# Patient Record
Sex: Female | Born: 1995 | Race: Black or African American | Hispanic: No | Marital: Single | Smoking: Never smoker
Health system: Southern US, Community
[De-identification: ages and names within clinical notes are randomized; demographics above are authoritative.]

## PROBLEM LIST (undated history)

## (undated) DIAGNOSIS — Z973 Presence of spectacles and contact lenses: Secondary | ICD-10-CM

## (undated) DIAGNOSIS — J4520 Mild intermittent asthma, uncomplicated: Secondary | ICD-10-CM

## (undated) DIAGNOSIS — R109 Unspecified abdominal pain: Secondary | ICD-10-CM

## (undated) DIAGNOSIS — R10A2 Flank pain, left side: Secondary | ICD-10-CM

## (undated) DIAGNOSIS — R319 Hematuria, unspecified: Secondary | ICD-10-CM

## (undated) HISTORY — PX: WISDOM TOOTH EXTRACTION: SHX21

---
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
## 2017-01-06 ENCOUNTER — Ambulatory Visit: Payer: 59 | Admitting: Internal Medicine

## 2017-01-20 ENCOUNTER — Ambulatory Visit: Payer: 59 | Admitting: Internal Medicine

## 2017-07-05 ENCOUNTER — Other Ambulatory Visit: Payer: Self-pay

## 2017-07-05 ENCOUNTER — Ambulatory Visit: Payer: 59 | Admitting: Internal Medicine

## 2017-07-05 ENCOUNTER — Encounter: Payer: Self-pay | Admitting: Internal Medicine

## 2017-07-05 VITALS — BP 104/64 | HR 95 | Temp 98.8°F | Ht 62.0 in | Wt 188.0 lb

## 2017-07-05 DIAGNOSIS — E6609 Other obesity due to excess calories: Secondary | ICD-10-CM | POA: Diagnosis not present

## 2017-07-05 DIAGNOSIS — Z113 Encounter for screening for infections with a predominantly sexual mode of transmission: Secondary | ICD-10-CM

## 2017-07-05 DIAGNOSIS — R4586 Emotional lability: Secondary | ICD-10-CM

## 2017-07-05 DIAGNOSIS — Z3009 Encounter for other general counseling and advice on contraception: Secondary | ICD-10-CM

## 2017-07-05 DIAGNOSIS — Z6834 Body mass index (BMI) 34.0-34.9, adult: Secondary | ICD-10-CM

## 2017-07-05 DIAGNOSIS — E669 Obesity, unspecified: Secondary | ICD-10-CM

## 2017-07-05 DIAGNOSIS — M7989 Other specified soft tissue disorders: Secondary | ICD-10-CM

## 2017-07-05 DIAGNOSIS — Z0000 Encounter for general adult medical examination without abnormal findings: Secondary | ICD-10-CM
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
## 2017-07-05 NOTE — Patient Instructions (Signed)
Samantha Rios,  I will call with lab results. Try putting a pillow under legs at night as a start. Increasing physical activity and drinking plenty of water (up to 2-3 L a day!) may help.  Please return at your convenience for possible IUD insertion. Let them know what you are calling for when they schedule so they give you a little longer appointment.  I recommend getting 150 minutes of moderate intensity exercise a week and trying to eat as much fresh/unprocessed food as possible. Meal prepping is a great strategy!  Best, Dr. Sampson GoonFitzgerald

## 2017-07-06 ENCOUNTER — Encounter: Payer: Self-pay | Admitting: Internal Medicine

## 2017-07-06 DIAGNOSIS — M7989 Other specified soft tissue disorders: Secondary | ICD-10-CM | POA: Insufficient documentation

## 2017-07-06 DIAGNOSIS — R4586 Emotional lability: Secondary | ICD-10-CM | POA: Insufficient documentation

## 2017-07-06 DIAGNOSIS — Z3009 Encounter for other general counseling and advice on contraception: Secondary | ICD-10-CM | POA: Insufficient documentation

## 2017-07-06 LAB — CBC
HEMATOCRIT: 37.6 % (ref 34.0–46.6)
Hemoglobin: 12.1 g/dL (ref 11.1–15.9)
MCH: 27.5 pg (ref 26.6–33.0)
MCHC: 32.2 g/dL (ref 31.5–35.7)
MCV: 86 fL (ref 79–97)
Platelets: 407 10*3/uL (ref 150–450)
RBC: 4.4 x10E6/uL (ref 3.77–5.28)
RDW: 12.7 % (ref 12.3–15.4)
WBC: 5.5 10*3/uL (ref 3.4–10.8)

## 2017-07-06 LAB — COMPREHENSIVE METABOLIC PANEL
A/G RATIO: 1.3 (ref 1.2–2.2)
ALT: 19 IU/L (ref 0–32)
AST: 16 IU/L (ref 0–40)
Albumin: 4.1 g/dL (ref 3.5–5.5)
Alkaline Phosphatase: 81 IU/L (ref 39–117)
BUN / CREAT RATIO: 12 (ref 9–23)
BUN: 8 mg/dL (ref 6–20)
Bilirubin Total: 0.5 mg/dL (ref 0.0–1.2)
CALCIUM: 9.6 mg/dL (ref 8.7–10.2)
CO2: 24 mmol/L (ref 20–29)
Chloride: 106 mmol/L (ref 96–106)
Creatinine, Ser: 0.67 mg/dL (ref 0.57–1.00)
GFR, EST AFRICAN AMERICAN: 147 mL/min/{1.73_m2} (ref >59)
GFR, EST NON AFRICAN AMERICAN: 128 mL/min/{1.73_m2} (ref >59)
GLOBULIN, TOTAL: 3.2 g/dL (ref 1.5–4.5)
Glucose: 88 mg/dL (ref 65–99)
POTASSIUM: 4.1 mmol/L (ref 3.5–5.2)
SODIUM: 134 mmol/L (ref 134–144)
Total Protein: 7.3 g/dL (ref 6.0–8.5)

## 2017-07-06 LAB — RPR: RPR Ser Ql: NONREACTIVE

## 2017-07-06 LAB — HIV ANTIBODY (ROUTINE TESTING W REFLEX): HIV Screen 4th Generation wRfx: NONREACTIVE

## 2017-07-06 LAB — TSH: TSH: 2.52 u[IU]/mL (ref 0.450–4.500)

## 2017-07-06 NOTE — Assessment & Plan Note (Signed)
-   Will continue OCP for time being. Plans to return for IUD placement.

## 2017-07-06 NOTE — Assessment & Plan Note (Signed)
-   Goals are to limit fast food and continue meal prep. - Once returns to campus for college fall semester, will consider organized exercise classes.
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
## 2017-07-06 NOTE — Assessment & Plan Note (Addendum)
-   Not present today. Will check TSH, CBC, and CMP given history of obesity, sleep disturbance, anemia, and kidney infection. Likely benign, perhaps due to increased temperatures, given patient's age and reversibility of swelling. No calf pain or swelling to suggest DVT.

## 2017-07-06 NOTE — Assessment & Plan Note (Signed)
-   Will obtain RPR and HIV screening. Recommended consistent condom use.

## 2017-07-09 ENCOUNTER — Other Ambulatory Visit: Payer: Self-pay | Admitting: Internal Medicine

## 2017-07-09 DIAGNOSIS — Z30011 Encounter for initial prescription of contraceptive pills: Secondary | ICD-10-CM

## 2017-07-14 ENCOUNTER — Inpatient Hospital Stay (HOSPITAL_COMMUNITY)
Admission: AD | Admit: 2017-07-14 | Discharge: 2017-07-14 | Disposition: A | Discharge status: Home / Self Care | Payer: 59 | Source: Ambulatory Visit | Attending: Obstetrics & Gynecology | Admitting: Obstetrics & Gynecology

## 2017-07-14 ENCOUNTER — Encounter (HOSPITAL_COMMUNITY): Payer: Self-pay

## 2017-07-14 ENCOUNTER — Encounter: Payer: Self-pay | Admitting: Internal Medicine

## 2017-07-14 ENCOUNTER — Ambulatory Visit (INDEPENDENT_AMBULATORY_CARE_PROVIDER_SITE_OTHER): Payer: 59 | Admitting: Internal Medicine

## 2017-07-14 DIAGNOSIS — S3141XA Laceration without foreign body of vagina and vulva, initial encounter: Secondary | ICD-10-CM

## 2017-07-14 DIAGNOSIS — N898 Other specified noninflammatory disorders of vagina: Secondary | ICD-10-CM

## 2017-07-14 LAB — WET PREP, GENITAL
CLUE CELLS WET PREP: NONE SEEN
Sperm: NONE SEEN
Trich, Wet Prep: NONE SEEN
Yeast Wet Prep HPF POC: NONE SEEN

## 2017-07-14 MED ORDER — DOCUSATE SODIUM 100 MG PO CAPS: 100.0000 mg | ORAL_CAPSULE | Freq: Two times a day (BID) | ORAL | 0 refills | Status: DC

## 2017-07-14 MED ORDER — VALACYCLOVIR HCL 1 G PO TABS: 1000.0000 mg | ORAL_TABLET | Freq: Two times a day (BID) | ORAL | 1 refills | Status: AC

## 2017-07-14 MED ORDER — TRAMADOL HCL 50 MG PO TABS: 50.0000 mg | ORAL_TABLET | Freq: Three times a day (TID) | ORAL | 0 refills | Status: DC | PRN

## 2017-07-14 MED ORDER — ALPRAZOLAM 0.5 MG PO TABS
0.5000 mg | ORAL_TABLET | Freq: Once | ORAL | Status: AC
  Administered 2017-07-14: 0.5 mg via ORAL
  Filled 2017-07-14 (×2): qty 1

## 2017-07-14 MED ORDER — ALPRAZOLAM 1 MG PO TABS: 1.0000 mg | ORAL_TABLET | Freq: Once | ORAL | Status: DC

## 2017-07-14 MED ORDER — LIDOCAINE HCL URETHRAL/MUCOSAL 2 % EX GEL
1.0000 "application " | Freq: Once | CUTANEOUS | Status: AC
  Administered 2017-07-14: 1 via TOPICAL
  Filled 2017-07-14: qty 5

## 2017-07-14 NOTE — MAU Provider Note (Signed)
History     CSN: [id number]  Arrival date and time: 07/14/17 1310   First Provider Initiated Contact with Patient 07/14/17 1350      Chief Complaint  Patient presents with  . Vaginal Laceration   HPI   Ms.Samantha Rios is a 22 y.o. female G0P0000 here in MAU with a left sided vaginal laceration. The laceration occurred during intercourse last Sunday. Says she was having intercourse with a condom when it occurred. Says she didn't know it happened until 2 days after it occurred. Says she started feeling a lot of pain down there; worse on the left labia. Says the area is not bleeding at all. Says she is still having pain; she took tylenol which did not help.  She presented to [facility] family practice who sent her here for ? Sutures. Says she never bled with the laceration. Says she started having pain down there and then noticed a "cut".   OB History    Gravida  0   Para  0   Term  0   Preterm  0   AB  0   Living  0     SAB  0   TAB  0   Ectopic  0   Multiple  0   Live Births              Past Medical History:  Diagnosis Date  . Asthma, mild intermittent    seasonal  . Hematuria   . Left flank pain    05-13-2015-- admitted at [facility]ED ICU for LLQ and Fever (dx pyelonephritis , acute)  . Wears contact lenses     Past Surgical History:  Procedure Laterality Date  . CYSTOSCOPY WITH RETROGRADE PYELOGRAM, URETEROSCOPY AND STENT PLACEMENT Left 06/11/2015   Procedure: CYSTOSCOPY WITH RETROGRADE;  Surgeon: Heloise Purpura, MD;  Location: [facility];  Service: Urology;  Laterality: Left;  . WISDOM TOOTH EXTRACTION      Family History  Problem Relation Age of Onset  . Diabetes Mother   . Diabetes Maternal Grandmother   . Hypertension Maternal Grandmother   . Diabetes Maternal Grandfather   . Hypertension Maternal Grandfather   . Diabetes Paternal Grandfather   . Hypertension Paternal Grandfather     Social History   Tobacco Use  . Smoking  status: Never Smoker  . Smokeless tobacco: Never Used  Substance Use Topics  . Alcohol use: No    Alcohol/week: 0.0 oz  . Drug use: No    Allergies:  Allergies  Allergen Reactions  . Zyrtec [Cetirizine] Shortness Of Breath    Medications Prior to Admission  Medication Sig Dispense Refill Last Dose  . albuterol (PROVENTIL HFA;VENTOLIN HFA) 108 (90 Base) MCG/ACT inhaler Inhale 2 puffs into the lungs 2 (two) times daily. 1 Inhaler 1   . docusate sodium (COLACE) 100 MG capsule Take 1 capsule (100 mg total) by mouth 2 (two) times daily. 10 capsule 0   . SPRINTEC 28 0.25-35 MG-MCG tablet TAKE 1 TABLET BY MOUTH DAILY. 28 tablet 10   . traMADol (ULTRAM) 50 MG tablet Take 1 tablet (50 mg total) by mouth every 8 (eight) hours as needed. 15 tablet 0    No results found for this or any previous visit (from the past 48 hour(s)).  Review of Systems  Constitutional: Positive for fever (101 fever last night).  Gastrointestinal: Negative for abdominal pain.  Genitourinary: Positive for vaginal pain. Negative for dysuria and pelvic pain.   Physical Exam  Blood pressure (!) 107/56, pulse (!) 101, temperature 98.3 F (36.8 C), temperature source Oral, resp. rate 18, height 5\' 2"  (1.575 m), weight 189 lb 4.8 oz (85.9 kg), last menstrual period 06/16/2017.  Physical Exam  Constitutional: She is oriented to person, place, and time. She appears well-developed and well-nourished. No distress.  HENT:  Head: Normocephalic.  Eyes: Pupils are equal, round, and reactive to light.  GI: Soft. She exhibits no distension. There is no tenderness. There is no rebound.  Genitourinary:    There is tenderness on the right labia. There is tenderness on the left labia. Vaginal discharge found.  Genitourinary Comments: Multiple herpetic lesions noted along left labia  And near introitus. Lesion noted right labia and cultured for HSV  Musculoskeletal: Normal range of motion.  Neurological: She is alert and  oriented to person, place, and time.  Skin: Skin is warm. She is not diaphoretic.  Psychiatric: Her behavior is normal.    MAU Course  Procedures  None  MDM HSV culture collected.  Lidocaine Jelly given to patient.   Assessment and Plan   A:  1. Vaginal lesion   2. Vaginal discharge     P:  Concerning for HSV outbreak, unlikely laceration as it never bled and patient did not notice until 2 days following intercourse.  Rx: Valtrex Patient does not want to be called with the results Condoms always Follow up with Orvis BrillGyN  Rasch, Jennifer I, NP 07/16/2017 4:53 PM

## 2017-07-14 NOTE — MAU Note (Signed)
Has a vaginal lacerations for the past couple days. Bleeding the first day but none now. Reports lots of pain. Some new discharge, no odor, very watery. Antibiotic given by UC yesterday. Saw PCP today and they [facility].

## 2017-07-14 NOTE — Progress Notes (Signed)
   Subjective:   Patient: Samantha Rios       Birthdate: 12-Apr-1995       MRN: [id number]      HPI  Samantha Rios is a [age] y.o. female presenting for same day appt for vaginal laceration.   Vaginal laceration Occurred two nights ago while having intercourse. Went to urgent care who told her to use hydrocortisone cream, A&D ointment, ice, and sitz baths. Also told her to spray area with water to help with pain, though she says this causes more pain. [facility] was [facility]. She notes a fever to 101F which improved with Tylenol yesterday. Endorses small amount of vaginal discharge as well. Has not had a BM for past three days, though purposefully has been trying not to have a BM because she is worried about the pain.   Smoking status reviewed. Patient is never smoker.   Review of Systems See HPI.     Objective:  Physical Exam  Constitutional: She is oriented to person, place, and time. She appears well-developed and well-nourished. No distress.  HENT:  Head: Normocephalic and atraumatic.  Pulmonary/Chest: Effort normal. No respiratory distress.  Genitourinary:  Genitourinary Comments: Laceration ~1cm in length located on L labia majora. No erythema. No vaginal discharge.   Neurological: She is alert and oriented to person, place, and time.  Psychiatric: She has a normal mood and affect. Her behavior is normal.      Assessment & Plan:  Vaginal laceration Occurred during intercourse 2 nights ago. Located L labia. No signs of infection. Not actively bleeding. Borders well-approximated. Do not feel that suturing is necessary, which was discussed with patient. Will give limited amount of Tramadol for pain as OTC meds not effective. Also discussed conservative measures for pain relief such as sitz baths and ice packs. Will give Colace as patient with constipation but concerned about pain with BM. F/u if no improvement.    Tarri AbernethyAbigail J Lancaster, MD, MPH PGY-3 [facility]  [facility] Pager [phone]

## 2017-07-14 NOTE — Discharge Instructions (Signed)
Herpes Simplex Test There are two common types of herpes simplex virus (HSV). These are classified as Type 1 (HSV1) or Type 2 (HSV2). Type 1 often causes cold sores on or around the mouth and sometimes on or around the eyes. Type 2 is commonly known as a sexually transmitted infection that causes sores in and around the genitals. Both types of herpes simplex can cause sores in different areas. There are two types of herpes simplex tests. These include:  Culture. This consists of collecting and testing a sample of fluid with a cotton swab from an open sore. This can only be done during an active infection (outbreak). Culture tests take several days to complete but are very accurate.  HSV blood tests. This test is not as accurate as a culture. However, HSV blood tests are faster than cultures, often providing a test result within one day. ? HSV antibody test. This checks for the presence of antibodies against HSV in your blood. Antibodies are proteins your body makes to help fight infection. ? HSV antigen test. This checks for the presence of the HSV germ (antigen) in your blood.  Your health care provider may recommend you have a HSV test if:  He or she believes you have a HSV infection.  You have a weakened immune system (immunocompromised) and you have sores around your mouth or genitals that look like HSV eruptions.  You have a fever of unknown origin (FUO).  You are pregnant, have herpes, and are expecting to deliver a baby vaginally in the next 6-8 weeks.  What do the results mean? It is your responsibility to obtain your test results. Ask the lab or department performing the test when and how you will get your results. Contact your health care provider to discuss any questions you have about your results. Range of Normal Values Ranges for normal values may vary among different labs and hospitals. You should always check with your health care provider after having lab work or other tests  done to discuss whether your values are considered within normal limits. Normal findings include:  No HSV antigen or antibodies present in your blood.  No HSV antigen present in cultured fluid.  Meaning of Results Outside Normal Ranges The following test results may indicate that you have an active HSV infection:  Positive culture for HSV1 or HSV2.  Presence of HSV1 or HSV2 antigens in your blood.  Presence of certain HSV1 or HSV2 antibodies (IgM) in your blood.  Discuss your test results with your health care provider. He or she will use the results to make a diagnosis and determine a treatment plan that is right for you. Talk with your health care provider to discuss your results, treatment options, and if necessary, the need for more tests. Talk with your health care provider if you have any questions about your results. This information is not intended to replace advice given to you by your health care provider. Make sure you discuss any questions you have with your health care provider. Document Released: 02/13/2004 Document Revised: 09/16/2015 Document Reviewed: 05/28/2013 Elsevier Interactive Patient Education  2018 Elsevier Inc.  

## 2017-07-14 NOTE — Patient Instructions (Addendum)
It was nice meeting you today Samantha Rios!  For pain, you can continue to take Tylenol. You can also take one Tramadol tablet up to every 8 hours as needed for pain. Also begin taking colace (stool softener) twice a day to help prevent pain with bowel movements. Continue to take sitz baths and use ice packs as needed. You can use Vaseline or A&D ointment to help with friction.   If you are not getting better in about 2 weeks, or if your pain worsens, please let us know.   If your fevers worsen or do not go away with Tylenol, please let us know. If you develop chills, abdominal pain, nausea, or vomiting, please let us know.   If you have any questions or concerns, please feel free to call the clinic.   Be well,  Dr. Natale MilchLancaster

## 2017-07-14 NOTE — Progress Notes (Addendum)
Called to patient's room. Patient requests that the results of her labwork be given to her support person at the bedside. Will update provider with patient's request.

## 2017-07-17 DIAGNOSIS — S3141XA Laceration without foreign body of vagina and vulva, initial encounter: Secondary | ICD-10-CM | POA: Insufficient documentation

## 2017-07-17 LAB — HSV CULTURE AND TYPING

## 2017-07-17 LAB — GC/CHLAMYDIA PROBE AMP (~~LOC~~) NOT AT ARMC
Chlamydia: POSITIVE — AB
Neisseria Gonorrhea: NEGATIVE

## 2017-07-17 NOTE — Assessment & Plan Note (Signed)
Occurred during intercourse 2 nights ago. Located L labia. No signs of infection. Not actively bleeding. Borders well-approximated. Do not feel that suturing is necessary, which was discussed with patient. Will give limited amount of Tramadol for pain as OTC meds not effective. Also discussed conservative measures for pain relief such as sitz baths and ice packs. Will give Colace as patient with constipation but concerned about pain with BM. F/u if no improvement.

## 2017-07-21 ENCOUNTER — Telehealth: Payer: Self-pay | Admitting: Medical

## 2017-07-21 DIAGNOSIS — A749 Chlamydial infection, unspecified: Secondary | ICD-10-CM

## 2017-07-21 MED ORDER — AZITHROMYCIN 250 MG PO TABS: 1000.0000 mg | ORAL_TABLET | Freq: Once | ORAL | 0 refills | Status: AC

## 2017-07-21 NOTE — Telephone Encounter (Addendum)
[name] tested positive for  Chlamydia. Patient was called by RN and allergies and pharmacy confirmed. Rx sent to pharmacy of choice.   [name], Matthewjames Petrasek N, PA-C 07/21/2017 10:18 AM      ----- Message from Kathe BectonLori S Berdik, RN sent at 07/21/2017 10:10 AM EDT ----- This patient tested positive for :  chlamydia  She (,"is allergic to Zyrtec") I have informed the patient of her results and confirmed her pharmacy is correct in her chart. Please send Rx.   Thank you,   Kathe BectonBerdik, Lori S, RN   [facility]

## 2017-07-24 ENCOUNTER — Encounter: Payer: Self-pay | Admitting: Obstetrics and Gynecology

## 2017-07-24 DIAGNOSIS — A6000 Herpesviral infection of urogenital system, unspecified: Secondary | ICD-10-CM | POA: Insufficient documentation

## 2017-08-07 ENCOUNTER — Ambulatory Visit: Payer: 59 | Admitting: Family Medicine

## 2017-08-08 ENCOUNTER — Encounter: Payer: Self-pay | Admitting: Family Medicine

## 2017-08-08 ENCOUNTER — Encounter: Payer: Self-pay | Admitting: Licensed Clinical Social Worker

## 2017-08-08 ENCOUNTER — Ambulatory Visit (INDEPENDENT_AMBULATORY_CARE_PROVIDER_SITE_OTHER): Payer: 59 | Admitting: Family Medicine

## 2017-08-08 ENCOUNTER — Other Ambulatory Visit: Payer: Self-pay

## 2017-08-08 VITALS — BP 110/80 | HR 100 | Temp 98.8°F | Wt 187.0 lb

## 2017-08-08 DIAGNOSIS — R350 Frequency of micturition: Secondary | ICD-10-CM

## 2017-08-08 DIAGNOSIS — N898 Other specified noninflammatory disorders of vagina: Secondary | ICD-10-CM | POA: Diagnosis not present

## 2017-08-08 DIAGNOSIS — A6004 Herpesviral vulvovaginitis: Secondary | ICD-10-CM

## 2017-08-08 DIAGNOSIS — F411 Generalized anxiety disorder: Secondary | ICD-10-CM | POA: Diagnosis not present

## 2017-08-08 LAB — POCT WET PREP (WET MOUNT)
CLUE CELLS WET PREP WHIFF POC: NEGATIVE
TRICHOMONAS WET PREP HPF POC: ABSENT
WBC, Wet Prep HPF POC: >20

## 2017-08-08 LAB — POCT URINALYSIS DIP (MANUAL ENTRY)
BILIRUBIN UA: NEGATIVE mg/dL
Bilirubin, UA: NEGATIVE
Blood, UA: NEGATIVE
Glucose, UA: NEGATIVE mg/dL
Nitrite, UA: NEGATIVE
PH UA: 8.5 — AB (ref 5.0–8.0)
PROTEIN UA: NEGATIVE mg/dL
Spec Grav, UA: 1.015 (ref 1.010–1.025)
UROBILINOGEN UA: 0.2 U/dL

## 2017-08-08 MED ORDER — HYDROXYZINE HCL 50 MG PO TABS: 50.0000 mg | ORAL_TABLET | Freq: Three times a day (TID) | ORAL | 0 refills | Status: DC | PRN

## 2017-08-08 MED ORDER — VALACYCLOVIR HCL 1 G PO TABS: 1000.0000 mg | ORAL_TABLET | Freq: Two times a day (BID) | ORAL | 0 refills | Status: DC
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
## 2017-08-08 NOTE — Patient Instructions (Addendum)
It was nice meeting you today.  You were seen in clinic for medication refill of acyclovir.  We performed a wet prep to check for yeast or bacterial infections.  I will call you once I have the results of these tests.  For your anxiety, we discussed speaking with the behavioral health specialist which you were agreeable to.  You met Neoma Laming during your visit today and she will schedule you for follow-up.  Additionally, we well try Atarax at bedtime which you may find helpful your symptoms.  I have sent this to your pharmacy and you can pick this up today.  Please call clinic if you have any questions.  Be well, Lovenia Kim MD

## 2017-08-16 ENCOUNTER — Ambulatory Visit: Payer: 59 | Admitting: Licensed Clinical Social Worker

## 2017-08-16 DIAGNOSIS — R4589 Other symptoms and signs involving emotional state: Secondary | ICD-10-CM

## 2017-08-16 DIAGNOSIS — F329 Major depressive disorder, single episode, unspecified: Secondary | ICD-10-CM
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
## 2017-08-17 DIAGNOSIS — N898 Other specified noninflammatory disorders of vagina: Secondary | ICD-10-CM | POA: Insufficient documentation

## 2017-08-17 DIAGNOSIS — F411 Generalized anxiety disorder: Secondary | ICD-10-CM | POA: Insufficient documentation

## 2017-08-17 MED ORDER — CEPHALEXIN 250 MG PO CAPS: 250.0000 mg | ORAL_CAPSULE | Freq: Four times a day (QID) | ORAL | 0 refills | Status: AC
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
## 2017-08-17 NOTE — Assessment & Plan Note (Signed)
Patient concerned she may have a yeast infection x1 week given vaginal irritation and itching.  Burning, but not with urination. She has no known exposure to STD, sexually active with female partners.  Does not desire workup for STD testing at this time. Will check wet prep as well as urine for possible UTI.  -UA -f/u wet prep -safe sex counseling

## 2017-08-17 NOTE — Assessment & Plan Note (Signed)
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 Will refill Valtrex 1000 mg to treat active lesions.  -counseled on safe sex

## 2017-08-21 ENCOUNTER — Other Ambulatory Visit: Payer: Self-pay | Admitting: Family Medicine

## 2017-08-21 MED ORDER — SERTRALINE HCL 50 MG PO TABS: 50.0000 mg | ORAL_TABLET | Freq: Every day | ORAL | 3 refills | Status: DC

## 2017-08-24 ENCOUNTER — Telehealth: Payer: Self-pay | Admitting: Licensed Clinical Social Worker

## 2017-08-24 NOTE — Progress Notes (Signed)
Service : Integrated Behavioral Health F/U Call   F/U call to patient reference interventions discussed and Rx called in by PCP.  Patient picked up Rx and will start taking it today.  [facility]. Update provided to PCP.  Sammuel Hineseborah Klaire Court, LCSW Licensed Clinical Social Worker [facility]   [phone] 11:18 AM

## 2017-09-13 ENCOUNTER — Other Ambulatory Visit: Payer: Self-pay | Admitting: Family Medicine

## 2017-09-30 ENCOUNTER — Other Ambulatory Visit: Payer: Self-pay | Admitting: Family Medicine

## 2017-11-28 ENCOUNTER — Ambulatory Visit (INDEPENDENT_AMBULATORY_CARE_PROVIDER_SITE_OTHER): Payer: 59 | Admitting: Licensed Clinical Social Worker

## 2017-11-28 DIAGNOSIS — R454 Irritability and anger: Secondary | ICD-10-CM | POA: Diagnosis not present

## 2017-11-28 NOTE — BH Specialist Note (Signed)
  Integrated Behavioral Health Follow Up Visit  MRN: [id number] Name: Samantha Rios  Number of Integrated Behavioral Health Clinician visits: 3/6 Session Start time: 3:30  Session End time: 4:05 Total time: 35 minutes  Reason for follow-up: Continue brief intervention to assist patient with managing symptoms of anxiety and depression, as well as managing stressors .  Report of symptoms: irritable and difficulty in daily functions.  ASSESSMENT:  Patient has make progress towards goal.  States she is feel better. This is also evident by PHQ-9/GAD score of 3.  Patient continues to take zoloft daily with no side effects. Relationship better with family.  She is experiencing minimal symptoms of depression /anxiety and is excited to return to school at [employer] in Jan. Patient may benefit from, and is in agreement to implement brief CBT to assist with managing her symptoms.  GOALS:Patient will: 1. Maintain reduced symptoms of: anxiety and depression 2. Increase knowledge and/or ability of: coping skills, self-management skills and stress reduction  3. Reduce irritable associated with her thoughts PLAN:  1.Patient will F/U with [facility] as needed  2.Behavioral recommendations: relaxed breathing as needed, complete thoughts & feeling log 3. Contact [facility] counseling department for support on campus 4. Referral: none at this time  Intervention: Solution-Focused Strategies and Brief CBT, Reflective listening, Behavioral Therapy (Relaxed breathing); Problem-solving teaching/coping strategies and Psychoeducation    GAD 7 : Generalized Anxiety Score 11/28/2017 08/16/2017  Nervous, Anxious, on Edge 0 3  Control/stop worrying 1 3  Worry too much - different things 0 3  Trouble relaxing 0 3  Restless 0 1  Easily annoyed or irritable 2 3  Afraid - awful might happen 0 2  Total GAD 7 Score 3 18  Anxiety Difficulty Somewhat difficult Very difficult   Depression screen [facility] 2/9 11/28/2017 08/16/2017 08/08/2017   Decreased Interest 0 3 0  Down, Depressed, Hopeless 0 3 0  PHQ - 2 Score 0 6 0  Altered sleeping 0 3 -  Tired, decreased energy 1 3 -  Change in appetite 0 3 -  Feeling bad or failure about yourself  1 3 -  Trouble concentrating 0 2 -  Moving slowly or fidgety/restless 1 0 -  Suicidal thoughts 0 1 -  PHQ-9 Score 3 21 -  Difficult doing work/chores Somewhat difficult Extremely dIfficult -   Sammuel Hines, LCSW Behavioral Health Clinician [facility]   [phone] 4:28 PM

## 2018-01-07 ENCOUNTER — Other Ambulatory Visit: Payer: Self-pay | Admitting: Family Medicine

## 2018-02-09 ENCOUNTER — Other Ambulatory Visit: Payer: Self-pay | Admitting: Family Medicine

## 2018-03-19 ENCOUNTER — Other Ambulatory Visit: Payer: Self-pay | Admitting: Internal Medicine

## 2018-03-19 DIAGNOSIS — Z30011 Encounter for initial prescription of contraceptive pills: Secondary | ICD-10-CM

## 2018-03-19 MED ORDER — NORGESTIMATE-ETH ESTRADIOL 0.25-35 MG-MCG PO TABS: 1.0000 | ORAL_TABLET | Freq: Every day | ORAL | 3 refills | Status: DC

## 2018-03-19 NOTE — Addendum Note (Signed)
Addended by: BRAKE, ALISA S on: 03/19/2018 11:27 AM   Modules accepted: Orders  

## 2018-05-31 ENCOUNTER — Other Ambulatory Visit: Payer: Self-pay | Admitting: Family Medicine

## 2018-06-13 ENCOUNTER — Ambulatory Visit (INDEPENDENT_AMBULATORY_CARE_PROVIDER_SITE_OTHER): Payer: 59 | Admitting: Family Medicine

## 2018-06-13 ENCOUNTER — Other Ambulatory Visit: Payer: Self-pay

## 2018-06-13 VITALS — BP 102/62 | HR 99

## 2018-06-13 DIAGNOSIS — R21 Rash and other nonspecific skin eruption: Secondary | ICD-10-CM | POA: Insufficient documentation

## 2018-06-13 MED ORDER — TRIAMCINOLONE ACETONIDE 0.5 % EX OINT: 1.0000 "application " | TOPICAL_OINTMENT | Freq: Two times a day (BID) | CUTANEOUS | 0 refills | Status: DC

## 2018-06-13 NOTE — Assessment & Plan Note (Signed)
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  Unclear etiology though likely to be secondary to allergic reaction possibly from gluten.  Patient with good response from antihistamine topical.  However secondary to experience breakouts.  Will prescribe tamsulosin 0.5% ointment and Benadryl p.o. as needed.  Patient has allergy to Zyrtec therefore will stay away from second-generation antihistamine.  Also refer patient to allergy/immunology for testing.  Follow-up with PCP as needed.

## 2018-06-13 NOTE — Patient Instructions (Addendum)
It was great seeing you today! We have addressed the following issues today  1. Use the steroid cream two times a day for the next 10-14 days 2. I made a referral to allergy and immunology for testing. 3. Avoid gluten  4. Take benadryl by mouth as needed if ointment does not help with itching. 5. Follow up with PCP as needed.  If we did any lab work today, and the results require attention, either me or my nurse will get in touch with you. If everything is normal, you will get a letter in mail and a message via . If you don't hear from [place] in two weeks, please give [place] a call. Otherwise, we look forward to seeing you again at your next visit. If you have any questions or concerns before then, please call the clinic at [phone].  Please bring all your medications to every doctors visit  Sign up for My Chart to have easy access to your labs results, and communication with your Primary care physician. Please ask Front Desk for some assistance.   Please check-out at the front desk before leaving the clinic.    Take Care,   Dr. Sydnee Cabal

## 2018-06-13 NOTE — Progress Notes (Signed)
   Subjective:    Patient ID: Samantha Rios, female    DOB: Sep 26, 1995, 23 y.o.   MRN: [id number]   CC: Rash  HPI: Patient is a 23 year old female with past medical history significant for asthma who presents today complaining of a rash both arms.  Patient reports that she has had rash since February intermittently.  She describes the rash has been very itchy.  She has been using Benadryl cream with good response and as needed oral Benadryl.  Patient has also tried in the past hydrocortisone cream with little relief in her symptoms.  Patient unclear what might be causing breakouts.  Thought to be initially secondary to stress from school (patient is a Consulting civil engineer at [employer]), however patient has been out of school since February and continues to notice breakouts.  Patient has noticed that she precasting rash when she eats pasta and thought that she may have an allergy to gluten.  She has tried to change her diet to some extent but has not cut out gluten completely.  Patient is otherwise denying any chest pain, trouble breathing, fever, chills, nausea or vomiting.  Smoking status reviewed   ROS: all other systems were reviewed and are negative other than in the HPI   Past Medical History:  Diagnosis Date  . Asthma, mild intermittent    seasonal  . Hematuria   . Left flank pain    05-13-2015-- admitted at [facility] ICU for LLQ and Fever (dx pyelonephritis , acute)  . Wears contact lenses     Past Surgical History:  Procedure Laterality Date  . CYSTOSCOPY WITH RETROGRADE PYELOGRAM, URETEROSCOPY AND STENT PLACEMENT Left 06/11/2015   Procedure: CYSTOSCOPY WITH RETROGRADE;  Surgeon: Heloise Purpura, MD;  Location: [facility];  Service: Urology;  Laterality: Left;  . WISDOM TOOTH EXTRACTION      Past medical history, surgical, family, and social history reviewed and updated in the EMR as appropriate.  Objective:  BP 102/62   Pulse 99   SpO2 97%   Vitals and nursing note  reviewed  General: NAD, pleasant, able to participate in exam Cardiac: RRR, normal heart sounds, no murmurs. 2+ radial and PT pulses bilaterally Respiratory: CTAB, normal effort, No wheezes, rales or rhonchi Abdomen: soft, nontender, nondistended, no hepatic or splenomegaly, +BS Extremities: no edema or cyanosis. WWP. Skin: Macular lesions with surrounding erythema noted bilaterally and upper extremity, some excoriation noted.  No drainage, fluctuance. 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  Unclear etiology though likely to be secondary to allergic reaction possibly from gluten.  Patient with good response from antihistamine topical.  However secondary to experience breakouts.  Will prescribe tamsulosin 0.5% ointment and Benadryl p.o. as needed.  Patient has allergy to Zyrtec therefore will stay away from second-generation antihistamine.  Also refer patient to allergy/immunology for testing.  Follow-up with PCP as needed.    Lovena Neighbours, MD [facility] PGY-3

## 2018-07-02 ENCOUNTER — Other Ambulatory Visit: Payer: Self-pay | Admitting: Family Medicine

## 2018-08-26 ENCOUNTER — Telehealth: Payer: Self-pay | Admitting: Family Medicine

## 2018-08-26 NOTE — Telephone Encounter (Signed)
**  After Hours/ Emergency Line Call**  Received a call to report that Samantha Rios is having fevers.  Aunt Irena Delovatch. Endorsing started having a gushy flow Tuesday and had test Covid positive on Friday. On Tuesday had low fever. Everyday since then has had fever >101, highest 101.9. Taking tylenol and ibuprofen every 3-4 hours. Wednesday starting having coughing and sore throat. When she gets up she feels winded, but she does not report feeling short of breath.  She is also reporting body aches.  They are calling in order to determine if there is something that they should do or when they should be worried about her fevers.  Counseled them that she should stay well-hydrated and continue using the Tylenol and ibuprofen.  As there is not much further to do.  She may also use honey for cough.  Advised that she may schedule a telemedicine visit next week if she is still not feeling better.  Recommended that she self quarantine, stay hydrated continue symptomatic care and return to the ED if she develops worsening shortness of breath.   Red flags discussed.  Will forward to PCP.  [place] Meleana Commerford, DO PGY-3, [facility] 08/26/2018 8:07 AM

## 2018-09-03 ENCOUNTER — Other Ambulatory Visit: Payer: Self-pay | Admitting: *Deleted

## 2018-09-03 DIAGNOSIS — R21 Rash and other nonspecific skin eruption: Secondary | ICD-10-CM

## 2018-09-06 MED ORDER — VALACYCLOVIR HCL 1 G PO TABS: 1000.0000 mg | ORAL_TABLET | Freq: Every day | ORAL | 0 refills | Status: AC

## 2018-09-06 MED ORDER — TRIAMCINOLONE ACETONIDE 0.5 % EX OINT: 1.0000 "application " | TOPICAL_OINTMENT | Freq: Two times a day (BID) | CUTANEOUS | 0 refills | Status: DC

## 2018-11-23 ENCOUNTER — Other Ambulatory Visit: Payer: Self-pay

## 2018-11-23 MED ORDER — SERTRALINE HCL 50 MG PO TABS: 50.0000 mg | ORAL_TABLET | Freq: Every day | ORAL | 2 refills | Status: DC

## 2019-01-01 ENCOUNTER — Other Ambulatory Visit: Payer: Self-pay

## 2019-01-02 ENCOUNTER — Other Ambulatory Visit: Payer: Self-pay | Admitting: Family Medicine

## 2019-01-02 DIAGNOSIS — A6004 Herpesviral vulvovaginitis: Secondary | ICD-10-CM

## 2019-01-02 MED ORDER — VALACYCLOVIR HCL 1 G PO TABS: 1000.0000 mg | ORAL_TABLET | Freq: Every day | ORAL | 0 refills | Status: DC

## 2019-01-02 NOTE — Telephone Encounter (Signed)
Attempted to call patient.  No answer and voice mail was not specific to patient.  No message was left.  Will try calling again later.  Samantha Rios, [place]

## 2019-01-02 NOTE — Telephone Encounter (Signed)
Sent 5 day supply into [facility] on [address]. Please have her follow up in the clinic if it is not improved.   Thank you,  Dr Higinio Plan

## 2019-01-29 ENCOUNTER — Other Ambulatory Visit: Payer: Self-pay | Admitting: Family Medicine

## 2019-01-29 DIAGNOSIS — R21 Rash and other nonspecific skin eruption: Secondary | ICD-10-CM

## 2019-03-08 ENCOUNTER — Other Ambulatory Visit: Payer: Self-pay | Admitting: Family Medicine

## 2019-03-08 DIAGNOSIS — A6004 Herpesviral vulvovaginitis: Secondary | ICD-10-CM

## 2019-04-19 ENCOUNTER — Other Ambulatory Visit: Payer: Self-pay | Admitting: Family Medicine

## 2019-04-23 ENCOUNTER — Other Ambulatory Visit: Payer: Self-pay | Admitting: Family Medicine

## 2019-04-23 DIAGNOSIS — A6004 Herpesviral vulvovaginitis: Secondary | ICD-10-CM

## 2019-11-04 ENCOUNTER — Other Ambulatory Visit: Payer: Self-pay | Admitting: Family Medicine

## 2019-11-04 DIAGNOSIS — A6004 Herpesviral vulvovaginitis: Secondary | ICD-10-CM

## 2020-01-22 ENCOUNTER — Other Ambulatory Visit: Payer: Self-pay | Admitting: *Deleted

## 2020-01-22 DIAGNOSIS — Z30011 Encounter for initial prescription of contraceptive pills: Secondary | ICD-10-CM

## 2020-01-22 MED ORDER — NORGESTIMATE-ETH ESTRADIOL 0.25-35 MG-MCG PO TABS: 1.0000 | ORAL_TABLET | Freq: Every day | ORAL | 3 refills | Status: AC

## 2020-01-23 ENCOUNTER — Other Ambulatory Visit: Payer: Self-pay | Admitting: *Deleted

## 2020-01-23 DIAGNOSIS — Z30011 Encounter for initial prescription of contraceptive pills: Secondary | ICD-10-CM

## 2020-07-13 ENCOUNTER — Other Ambulatory Visit (HOSPITAL_COMMUNITY)
Admission: RE | Admit: 2020-07-13 | Discharge: 2020-07-13 | Disposition: A | Discharge status: Home / Self Care | Payer: 59 | Source: Ambulatory Visit | Attending: Family Medicine | Admitting: Family Medicine

## 2020-07-13 ENCOUNTER — Encounter: Payer: Self-pay | Admitting: Family Medicine

## 2020-07-13 ENCOUNTER — Ambulatory Visit (INDEPENDENT_AMBULATORY_CARE_PROVIDER_SITE_OTHER): Payer: 59

## 2020-07-13 ENCOUNTER — Other Ambulatory Visit: Payer: Self-pay | Admitting: Family Medicine

## 2020-07-13 ENCOUNTER — Other Ambulatory Visit: Payer: Self-pay

## 2020-07-13 ENCOUNTER — Ambulatory Visit (INDEPENDENT_AMBULATORY_CARE_PROVIDER_SITE_OTHER): Payer: 59 | Admitting: Family Medicine

## 2020-07-13 VITALS — BP 123/83 | HR 97 | Ht 62.0 in | Wt 219.2 lb

## 2020-07-13 DIAGNOSIS — Z124 Encounter for screening for malignant neoplasm of cervix: Secondary | ICD-10-CM | POA: Insufficient documentation

## 2020-07-13 DIAGNOSIS — Z113 Encounter for screening for infections with a predominantly sexual mode of transmission: Secondary | ICD-10-CM

## 2020-07-13 DIAGNOSIS — Z23 Encounter for immunization: Secondary | ICD-10-CM

## 2020-07-13 DIAGNOSIS — Z1159 Encounter for screening for other viral diseases: Secondary | ICD-10-CM | POA: Insufficient documentation

## 2020-07-13 DIAGNOSIS — R300 Dysuria: Secondary | ICD-10-CM

## 2020-07-13 DIAGNOSIS — N760 Acute vaginitis: Secondary | ICD-10-CM | POA: Insufficient documentation

## 2020-07-13 DIAGNOSIS — Z114 Encounter for screening for human immunodeficiency virus [HIV]: Secondary | ICD-10-CM | POA: Insufficient documentation

## 2020-07-13 DIAGNOSIS — B9689 Other specified bacterial agents as the cause of diseases classified elsewhere: Secondary | ICD-10-CM

## 2020-07-13 LAB — POCT WET PREP (WET MOUNT)
Clue Cells Wet Prep Whiff POC: POSITIVE
Trichomonas Wet Prep HPF POC: ABSENT

## 2020-07-13 LAB — POCT URINALYSIS DIP (MANUAL ENTRY)
Bilirubin, UA: NEGATIVE
Glucose, UA: NEGATIVE mg/dL
Ketones, POC UA: NEGATIVE mg/dL
Nitrite, UA: NEGATIVE
Protein Ur, POC: NEGATIVE mg/dL
Spec Grav, UA: 1.02 (ref 1.010–1.025)
Urobilinogen, UA: 0.2 E.U./dL
pH, UA: 8.5 — AB (ref 5.0–8.0)

## 2020-07-13 LAB — POCT UA - MICROSCOPIC ONLY

## 2020-07-13 MED ORDER — METRONIDAZOLE 500 MG PO TABS: 500.0000 mg | ORAL_TABLET | Freq: Two times a day (BID) | ORAL | 0 refills | Status: DC

## 2020-07-13 NOTE — Assessment & Plan Note (Signed)
Patient screened for STIs -We will follow-up with results and treat as needed

## 2020-07-13 NOTE — Assessment & Plan Note (Signed)
-  Pap smear performed at today's visit, we will follow-up with results and STI check and treat as needed

## 2020-07-13 NOTE — Assessment & Plan Note (Signed)
-  Patient screened today for hepatitis C

## 2020-07-13 NOTE — Patient Instructions (Signed)
Thank you for coming in to see [place] today! Please see below to review our plan for today's visit:  1. We are assessing you for STIs and will follow up with results and treat as needed.  2. Let me know if you're interested in doing nail clipping for fungal assessment/treatment.   Please call the clinic at [phone] if your symptoms worsen or you have any concerns. It was our pleasure to serve you!    Dr. Peggyann Shoals [facility]

## 2020-07-13 NOTE — Assessment & Plan Note (Signed)
BV appreciated on her wet prep.  -Sent in Rx for Flagyl 500mg  BID x7 days, patient called with results

## 2020-07-13 NOTE — Assessment & Plan Note (Signed)
Patient experiencing dysuria, urinary frequency, and urinary urgency -UA performed at today's visit, will review results and treat as needed

## 2020-07-13 NOTE — Progress Notes (Signed)
    SUBJECTIVE:   CHIEF COMPLAINT / HPI:   Concern for UTI: Patient presents today with concerns for urinary tract infection.  She reports that she has been having blood in her urine for the past couple of days.  Also felt a crampy pain in her right lower quadrant.  Denies any nausea, vomiting, diarrhea, constipation.  She also endorses some urinary frequency and urinary urgency.  Patient's mom has a history of ovarian cysts.  Screening for STI: Patient is sexually active with 1 female partner.  Recently she endorses a fishy odor but denies vaginal itch vaginal pain (other than the occasional cramp of right lower quadrant pain).  She is on Sprintec for her birth control and has good compliance.  She would like to be assessed for STIs.  Health maintenance: Patient is due for Pap smear, chlamydia screening, hepatitis C screen, Tdap, and COVID-vaccine   PERTINENT  PMH / PSH:  Patient Active Problem List   Diagnosis Date Noted   Encounter for hepatitis C screening test for low risk patient 07/13/2020   Papanicolaou smear for cervical cancer screening 07/13/2020   Routine screening for STI (sexually transmitted infection) 07/13/2020   Encounter for screening for HIV 07/13/2020   Bacterial vaginosis 07/13/2020   Herpes genitalia 07/24/2017   Class 1 obesity due to excess calories without serious comorbidity with body mass index (BMI) of 34.0 to 34.9 in adult 10/21/2016   Dysuria 09/24/2015   Anemia 05/27/2015   Asthma, chronic 08/15/2014   Health care maintenance 08/15/2014    OBJECTIVE:   BP 123/83   Pulse 97   Ht 5\' 2"  (1.575 m)   Wt 219 lb 3.2 oz (99.4 kg)   SpO2 98%   BMI 40.09 kg/m    Physical exam: General: Well-appearing patient, no acute distress Respiratory: CTA bilaterally Cardio: RRR, S1-S2 present, no murmurs appreciated GU: Normal-appearing labia minora and majora without lesion, normal-appearing vaginal rugated with clear/white discharge, closed cervical os with  current bleeding (patient is on menstrual cycle), otherwise no cervical lesions appreciated   ASSESSMENT/PLAN:   Encounter for hepatitis C screening test for low risk patient -Patient screened today for hepatitis C  Papanicolaou smear for cervical cancer screening -Pap smear performed at today's visit, we will follow-up with results and STI check and treat as needed  Routine screening for STI (sexually transmitted infection) Patient screened for STIs -We will follow-up with results and treat as needed  Dysuria Patient experiencing dysuria, urinary frequency, and urinary urgency -UA performed at today's visit, will review results and treat as needed  Bacterial vaginosis BV appreciated on her wet prep.  -Sent in Rx for Flagyl 500mg  BID x7 days, patient called with results     , DO [facility]

## 2020-07-14 LAB — HIV ANTIBODY (ROUTINE TESTING W REFLEX): HIV Screen 4th Generation wRfx: NONREACTIVE

## 2020-07-14 LAB — HEPATITIS C ANTIBODY: Hep C Virus Ab: <0.1 s/co ratio (ref 0.0–0.9)

## 2020-07-14 LAB — RPR: RPR Ser Ql: NONREACTIVE

## 2020-07-15 LAB — CYTOLOGY - PAP
Chlamydia: NEGATIVE
Comment: NEGATIVE
Comment: NEGATIVE
Comment: NEGATIVE
Comment: NORMAL
Diagnosis: NEGATIVE
High risk HPV: NEGATIVE
Neisseria Gonorrhea: NEGATIVE
Trichomonas: NEGATIVE

## 2020-07-20 ENCOUNTER — Other Ambulatory Visit: Payer: Self-pay | Admitting: Family Medicine

## 2020-07-20 ENCOUNTER — Telehealth: Payer: Self-pay

## 2020-07-20 DIAGNOSIS — F419 Anxiety disorder, unspecified: Secondary | ICD-10-CM

## 2020-07-20 MED ORDER — SERTRALINE HCL 100 MG PO TABS: 100.0000 mg | ORAL_TABLET | Freq: Every day | ORAL | 0 refills | Status: DC

## 2020-07-20 NOTE — Telephone Encounter (Signed)
Patient calls nurse line requesting to increase zoloft to 75mg  or 100mg  daily. Patient reports she has been taking 1.5 tabs daily to help with increasing anxiety. PCP does not have any more availability left. Patient is asking for a new rx to be sent in and she will FU with her new PCP. Will forward to current PCP.

## 2020-07-20 NOTE — Telephone Encounter (Signed)
Rx for Zoloft 100 mg daily sent in.  Can you please let the patient know and additionally schedule her for follow-up with new PCP in the next few weeks?  Thank you,  Allayne Stack, DO

## 2020-07-22 NOTE — Telephone Encounter (Signed)
Called patient. No answer, left HIPAA compliant VM for patient to return call to office to schedule follow up appointment.   Veronda Prude, RN

## 2020-08-12 ENCOUNTER — Other Ambulatory Visit: Payer: Self-pay | Admitting: Family Medicine

## 2020-08-12 DIAGNOSIS — A6004 Herpesviral vulvovaginitis: Secondary | ICD-10-CM

## 2020-09-08 ENCOUNTER — Other Ambulatory Visit: Payer: Self-pay | Admitting: Family Medicine

## 2020-09-08 DIAGNOSIS — F419 Anxiety disorder, unspecified: Secondary | ICD-10-CM

## 2021-06-29 ENCOUNTER — Encounter: Payer: Self-pay | Admitting: *Deleted

## 2021-07-23 ENCOUNTER — Ambulatory Visit (INDEPENDENT_AMBULATORY_CARE_PROVIDER_SITE_OTHER): Payer: 59 | Admitting: Family Medicine

## 2021-07-23 ENCOUNTER — Encounter: Payer: Self-pay | Admitting: Family Medicine

## 2021-07-23 VITALS — BP 122/78 | HR 102 | Ht 62.0 in | Wt 233.2 lb

## 2021-07-23 DIAGNOSIS — E6609 Other obesity due to excess calories: Secondary | ICD-10-CM | POA: Diagnosis not present

## 2021-07-23 DIAGNOSIS — R601 Generalized edema: Secondary | ICD-10-CM

## 2021-07-23 DIAGNOSIS — M25473 Effusion, unspecified ankle: Secondary | ICD-10-CM

## 2021-07-23 DIAGNOSIS — R45851 Suicidal ideations: Secondary | ICD-10-CM | POA: Diagnosis not present

## 2021-07-23 DIAGNOSIS — Z6834 Body mass index (BMI) 34.0-34.9, adult: Secondary | ICD-10-CM

## 2021-07-23 MED ORDER — BUSPIRONE HCL 5 MG PO TABS: 5.0000 mg | ORAL_TABLET | Freq: Three times a day (TID) | ORAL | 0 refills | Status: DC

## 2021-07-23 MED ORDER — SEMAGLUTIDE-WEIGHT MANAGEMENT 0.25 MG/0.5ML ~~LOC~~ SOAJ: 0.2500 mg | SUBCUTANEOUS | 0 refills | Status: DC

## 2021-07-23 NOTE — Progress Notes (Signed)
    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  She reports discomfort at the end of the day due to the swelling   Mother is present for encounter and reports that the patient was admitted   She states that when she elevates her feet, she does not notice much improvement in the swelling unless she elevates them for three hours  Her feet are sore to walk to the restroom   She reports being mindful of salt in her food and drinks water throughout the day   Has been trying to lose weight for 6 months   Denies medication chagnes  Diet consists of ***  She has not had this much swelling before ***  She has tried *** to relieve the swelling    Patient potentially worried about hgb a1c   She reports eating healthy and working out but is not able to lose weight  She reports having more swelling   PERTINENT  PMH / PSH: ***  OBJECTIVE:   There were no vitals taken for this visit.  ***  ASSESSMENT/PLAN:   No problem-specific Assessment & Plan notes found for this encounter.     Ronnald Ramp, MD [facility]

## 2021-07-23 NOTE — Patient Instructions (Signed)
I have prescribed Samantha Rios so that she can see if the cost will be reasonable.  He can begin these injections when he pick up the prescription.  I will follow-up with you for any abnormal results of the blood work collected today.  I have also prescribed BuSpar for you to take up to 3 times daily as needed for symptoms of depression.  If your feelings of suicidal ideation worsen or persist, I recommend being evaluated at the behavioral health urgent care here in [facility] if you are still in town.  Please continue to work with your therapist for talk therapy as well as taking the Zoloft daily to help with your mood.     If you are feeling suicidal or depression symptoms worsen please immediately go to:   If you are thinking about harming yourself or having thoughts of suicide, or if you know someone who is, seek help right away. If you are in crisis, make sure you are not left alone.  If someone else is in crisis, make sure he/she/they is not left alone  Call 988 OR 1-800-273-TALK  24 Hour Availability for Walk-IN services  [facility]  [address] [place], [place] [employer] [place] [phone] Crisis [phone]    Other crisis resources:  [facility] [employer] (Domestic Violence, Rape & Victim Assistance [phone]  [facility] [employer] Crisis [facility]    (ONLY from 8am-4pm)    [phone]  [facility] Mobile Crisis Unit (24/7)   [phone]  [place] National Suicide Hotline   [phone] Len Childs)

## 2021-07-24 LAB — CBC
Hematocrit: 36.1 % (ref 34.0–46.6)
Hemoglobin: 11.7 g/dL (ref 11.1–15.9)
MCH: 27.7 pg (ref 26.6–33.0)
MCHC: 32.4 g/dL (ref 31.5–35.7)
MCV: 85 fL (ref 79–97)
Platelets: 426 10*3/uL (ref 150–450)
RBC: 4.23 x10E6/uL (ref 3.77–5.28)
RDW: 11.6 % — ABNORMAL LOW (ref 11.7–15.4)
WBC: 6 10*3/uL (ref 3.4–10.8)

## 2021-07-24 LAB — COMPREHENSIVE METABOLIC PANEL
ALT: 25 IU/L (ref 0–32)
AST: 23 IU/L (ref 0–40)
Albumin/Globulin Ratio: 1.4 (ref 1.2–2.2)
Albumin: 4.2 g/dL (ref 3.9–5.0)
Alkaline Phosphatase: 88 IU/L (ref 44–121)
BUN/Creatinine Ratio: 13 (ref 9–23)
BUN: 9 mg/dL (ref 6–20)
Bilirubin Total: 0.3 mg/dL (ref 0.0–1.2)
CO2: 24 mmol/L (ref 20–29)
Calcium: 9.6 mg/dL (ref 8.7–10.2)
Chloride: 101 mmol/L (ref 96–106)
Creatinine, Ser: 0.72 mg/dL (ref 0.57–1.00)
Globulin, Total: 3 g/dL (ref 1.5–4.5)
Glucose: 80 mg/dL (ref 70–99)
Potassium: 4.3 mmol/L (ref 3.5–5.2)
Sodium: 136 mmol/L (ref 134–144)
Total Protein: 7.2 g/dL (ref 6.0–8.5)
eGFR: 120 mL/min/{1.73_m2} (ref >59)

## 2021-07-24 LAB — C-REACTIVE PROTEIN: CRP: 1 mg/L (ref 0–10)

## 2021-07-24 LAB — TSH: TSH: 3.01 u[IU]/mL (ref 0.450–4.500)

## 2021-07-26 DIAGNOSIS — R601 Generalized edema: Secondary | ICD-10-CM | POA: Insufficient documentation

## 2021-07-26 DIAGNOSIS — R45851 Suicidal ideations: Secondary | ICD-10-CM | POA: Insufficient documentation

## 2021-07-26 NOTE — Assessment & Plan Note (Signed)
There was initial concern for joint swelling so measured BmP, CrP, CBC, TSH  Will add on A1c given patient's concern for obesity and family hx  Swelling does not appear to be involving the joints, low suspicion for HF given patient's age and lack of cardiac hx, discussed differential including lymphedema and lipidema as likely causes  Patient to follow up in 1 week

## 2021-07-26 NOTE — Assessment & Plan Note (Signed)
Patient reports long hx of depression  Currently on Zoloft  Recommended regular use of this medication for best effect, patient voiced understanding  Patient is currently working with therapist Patient given resources should thoughts of SI increase in severity, she and her mother voiced understanding  Handout given with Suicide crisis numbers  buspar PRN given

## 2021-07-26 NOTE — Assessment & Plan Note (Signed)
Will add semaglutide to help with weight loss  Patient will continue diet and exercise management for weight loss  F/u in 1 week

## 2021-07-30 ENCOUNTER — Encounter: Payer: Self-pay | Admitting: Family Medicine

## 2021-07-30 ENCOUNTER — Ambulatory Visit (INDEPENDENT_AMBULATORY_CARE_PROVIDER_SITE_OTHER): Payer: 59 | Admitting: Family Medicine

## 2021-07-30 VITALS — BP 110/78 | HR 82 | Ht 62.75 in | Wt 234.0 lb

## 2021-07-30 DIAGNOSIS — Z6834 Body mass index (BMI) 34.0-34.9, adult: Secondary | ICD-10-CM | POA: Diagnosis not present

## 2021-07-30 DIAGNOSIS — E6609 Other obesity due to excess calories: Secondary | ICD-10-CM

## 2021-07-30 DIAGNOSIS — F339 Major depressive disorder, recurrent, unspecified: Secondary | ICD-10-CM | POA: Diagnosis not present

## 2021-07-30 LAB — POCT GLYCOSYLATED HEMOGLOBIN (HGB A1C): Hemoglobin A1C: 4.6 % (ref 4.0–5.6)

## 2021-07-30 MED ORDER — BUPROPION HCL ER (SR) 100 MG PO TB12: 100.0000 mg | ORAL_TABLET | Freq: Two times a day (BID) | ORAL | 1 refills | Status: AC

## 2021-07-30 MED ORDER — LIRAGLUTIDE 18 MG/3ML ~~LOC~~ SOPN: 0.6000 mg | PEN_INJECTOR | Freq: Every day | SUBCUTANEOUS | 3 refills | Status: DC

## 2021-07-30 NOTE — Assessment & Plan Note (Signed)
Unable to obtain prescription for Ozempic due to limited supply pharmacies in both [place] and [place] We will give patient printed prescription for liraglutide

## 2021-07-30 NOTE — Assessment & Plan Note (Signed)
Patient continues to have elevated PHQ-9 score with passive suicidal ideations and no current intent or active plan We will have patient continue with Zoloft 100 mg daily We will add wellbutrin 100mg   DC buspar

## 2021-07-30 NOTE — Patient Instructions (Addendum)
   [facility] - call Aug 30/31 for an appt   I have provided a printed prescription for liraglutide or Victoza to be used once weekly.  You will start with injecting 0.6 mg subcutaneously once daily for 1 week followed by increasing to 1.2 mg once daily for another week.  Please follow the instructions on the prescription for further dosing.  I recommend follow-up in 4 weeks to assess how it is impacting your weight loss.  I have also sent a prescription for you to start Wellbutrin 100 mg daily in addition to your Zoloft 100 mg daily.  If you have worsening feelings or thoughts of suicidal ideation, please be evaluated in the behavioral health urgent care or use the suicide crisis line below.   If you are feeling suicidal or depression symptoms worsen please immediately go to:   If you are thinking about harming yourself or having thoughts of suicide, or if you know someone who is, seek help right away. If you are in crisis, make sure you are not left alone.  If someone else is in crisis, make sure he/she/they is not left alone  Call 988 OR 1-800-273-TALK  24 Hour Availability for Walk-IN services  [facility]  [address] [place], [place] [employer] [phone] Crisis [phone]

## 2021-08-02 NOTE — Progress Notes (Signed)
SUBJECTIVE:   CHIEF COMPLAINT / HPI: f/u for depression and weight loss   Depression  Patient reports that she has started back on her Zoloft  She reports that she continues to have depressed mood daily  She states that she knows it takes a while for Zoloft to have an effect  She states that she did not start the buspar prescribed at her last visit as she did not have an increase in either her depression nor anxiety symptoms  She continues to mark "several days" for SI on her PHQ9  She denies plan, previous suicide attempt or intent  She has contact information for suicide crisis line and behavioral health UC (although patient lives in [place])    Weight Loss  Swelling in wrists and ankles  We reviewed her labs from the previous visit for weight loss and swelling in her upper and lower extremities  These labs were within normal limits  She has not noticed any change in her swelling (neither improved nor worsened)  She continues to deny joint pain in her upper or lower extremities  She states that she was unable to pick up the prescription for ozempic due to limited supply at several of the pharmacies that she checked in [place] and [place]  She has continued to work out and follow her diet plan     PERTINENT  PMH / PSH:  Depression  Obesity    OBJECTIVE:   BP 110/78   Pulse 82   Ht 5' 2.75" (1.594 m)   Wt 234 lb (106.1 kg)   LMP 07/04/2021 (Approximate)   SpO2 98%   BMI 41.78 kg/m   Physical Exam Constitutional:      General: She is not in acute distress.    Appearance: She is obese. She is not ill-appearing or toxic-appearing.  HENT:     Right Ear: External ear normal.     Left Ear: External ear normal.     Nose: Nose normal.     Mouth/Throat:     Pharynx: Oropharynx is clear.  Eyes:     Conjunctiva/sclera: Conjunctivae normal.  Cardiovascular:     Rate and Rhythm: Normal rate and regular rhythm.     Pulses: Normal pulses.     Heart sounds:  Normal heart sounds. No murmur heard.    No friction rub. No gallop.  Pulmonary:     Effort: Pulmonary effort is normal.     Breath sounds: Normal breath sounds. No stridor. No wheezing, rhonchi or rales.  Abdominal:     General: Bowel sounds are normal.     Palpations: Abdomen is soft.     Tenderness: There is no abdominal tenderness.  Musculoskeletal:     Cervical back: Normal range of motion.     Comments: Swelling proximal to bilateral wrist joints without erythema, no warmth to touch No tenderness to palpation of wrist joints   Swelling of LE bilaterally without pitting  Ankle joints have normal ROM  No redness or warmth to touch   Skin:    General: Skin is warm.     Capillary Refill: Capillary refill takes less than 2 seconds.     Findings: No rash.  Neurological:     General: No focal deficit present.     Mental Status: She is alert and oriented to person, place, and time.  Psychiatric:        Mood and Affect: Mood normal.        Behavior: Behavior normal.  Thought Content: Thought content normal.        Judgment: Judgment normal.     Comments: Appropriate eye contact normal rate of speech       ASSESSMENT/PLAN:   Depression, recurrent (HCC) Patient continues to have elevated PHQ-9 score with passive suicidal ideations and no current intent or active plan We will have patient continue with Zoloft 100 mg daily We will add wellbutrin 100mg   DC buspar   Class 1 obesity due to excess calories without serious comorbidity with body mass index (BMI) of 34.0 to 34.9 in adult Unable to obtain prescription for Ozempic due to limited supply pharmacies in both [place] and [place] We will give patient printed prescription for liraglutide     [place], MD [facility]

## 2022-03-19 ENCOUNTER — Encounter (HOSPITAL_COMMUNITY): Payer: Self-pay

## 2022-03-19 ENCOUNTER — Ambulatory Visit (HOSPITAL_COMMUNITY)
Admission: EM | Admit: 2022-03-19 | Discharge: 2022-03-19 | Disposition: A | Discharge status: Home / Self Care | Payer: 59 | Attending: Physician Assistant | Admitting: Physician Assistant

## 2022-03-19 DIAGNOSIS — J0140 Acute pansinusitis, unspecified: Secondary | ICD-10-CM

## 2022-03-19 DIAGNOSIS — Z1152 Encounter for screening for COVID-19: Secondary | ICD-10-CM | POA: Insufficient documentation

## 2022-03-19 LAB — POCT RAPID STREP A, ED / UC: Streptococcus, Group A Screen (Direct): NEGATIVE

## 2022-03-19 MED ORDER — AMOXICILLIN-POT CLAVULANATE 875-125 MG PO TABS: 1.0000 | ORAL_TABLET | Freq: Two times a day (BID) | ORAL | 0 refills | Status: AC

## 2022-03-19 NOTE — ED Provider Notes (Signed)
[place]    CSN: [id number] Arrival date & time: 03/19/22  1124      History   Chief Complaint Chief Complaint  Patient presents with   Cough    HPI Samantha Rios is a 27 y.o. female.   Patient presents today with a 4-day history of URI symptoms.  Upon further discussion she reports that initially she actually was sick over a week ago but did have an improvement of symptoms with recurrence and worsening 4 days ago.  She reports ongoing sinus congestion, sore throat, cough.  She has tried multiple over-the-counter medications including DayQuil, NyQuil, cold and flu, Benadryl, TheraFlu without improvement.  She denies any specific known sick contacts but does work at an [employer] is exposed to many illnesses.  She has had COVID with last episode approximately a year ago.  She has had COVID-19 vaccinations as well as 1 booster.  Denies any recent antibiotics or steroids.  She does have asthma but has not required her albuterol inhaler since symptoms began; reports this is primarily exercise-induced.  Reports current symptoms are similar to previous episodes of sinus infections.    Past Medical History:  Diagnosis Date   Asthma, mild intermittent    seasonal   Hematuria    Left flank pain    05-13-2015-- admitted at [facility] ICU for LLQ and Fever (dx pyelonephritis , acute)   Wears contact lenses     Patient Active Problem List   Diagnosis Date Noted   Depression, recurrent ([place]) 07/30/2021   Generalized edema 07/26/2021   Passive suicidal ideations 07/26/2021   Encounter for hepatitis C screening test for low risk patient 07/13/2020   Papanicolaou smear for cervical cancer screening 07/13/2020   Routine screening for STI (sexually transmitted infection) 07/13/2020   Encounter for screening for HIV 07/13/2020   Bacterial vaginosis 07/13/2020   Herpes genitalia 07/24/2017   Class 1 obesity due to excess calories without serious comorbidity with body mass  index (BMI) of 34.0 to 34.9 in adult 10/21/2016   Dysuria 09/24/2015   Anemia 05/27/2015   Asthma, chronic 08/15/2014   Health care maintenance 08/15/2014    Past Surgical History:  Procedure Laterality Date   CYSTOSCOPY WITH RETROGRADE PYELOGRAM, URETEROSCOPY AND STENT PLACEMENT Left 06/11/2015   Procedure: CYSTOSCOPY WITH RETROGRADE;  Surgeon: Raynelle Bring, MD;  Location: [facility];  Service: Urology;  Laterality: Left;   WISDOM TOOTH EXTRACTION      OB History     Gravida  0   Para  0   Term  0   Preterm  0   AB  0   Living  0      SAB  0   IAB  0   Ectopic  0   Multiple  0   Live Births               Home Medications    Prior to Admission medications   Medication Sig Start Date End Date Taking? Authorizing Provider  albuterol (PROVENTIL HFA;VENTOLIN HFA) 108 (90 Base) MCG/ACT inhaler Inhale 2 puffs into the lungs 2 (two) times daily. 10/18/16  Yes Rogue Bussing, MD  amoxicillin-clavulanate (AUGMENTIN) 875-125 MG tablet Take 1 tablet by mouth every 12 (twelve) hours. 03/19/22  Yes Vaishnav Demartin, Derry Skill, PA-C  norgestimate-ethinyl estradiol (SPRINTEC 28) 0.25-35 MG-MCG tablet Take 1 tablet by mouth daily. 01/22/20  Yes Patriciaann Clan, DO  buPROPion ER (WELLBUTRIN SR) 100 MG 12 hr tablet Take 1  tablet (100 mg total) by mouth 2 (two) times daily. 07/30/21   Simmons-Robinson, Makiera, MD  hydrOXYzine (ATARAX/VISTARIL) 50 MG tablet TAKE 1 TABLET (50 MG TOTAL) BY MOUTH 3 (THREE) TIMES DAILY AS NEEDED. 02/09/18   Lovenia Kim, MD  sertraline (ZOLOFT) 100 MG tablet TAKE 1 TABLET BY MOUTH EVERY DAY 09/09/20   Simmons-Robinson, Makiera, MD  triamcinolone ointment (KENALOG) 0.5 % APPLY TO AFFECTED AREA TWICE A DAY 01/30/19   Patriciaann Clan, DO  valACYclovir (VALTREX) 1000 MG tablet TAKE 1 TABLET BY MOUTH EVERY DAY FOR 5 DAYS 08/13/20   Simmons-Robinson, Riki Sheer, MD    Family History Family History  Problem Relation Age of Onset   Diabetes  Mother    Diabetes Maternal Grandmother    Hypertension Maternal Grandmother    Diabetes Maternal Grandfather    Hypertension Maternal Grandfather    Diabetes Paternal Grandfather    Hypertension Paternal Grandfather     Social History Social History   Tobacco Use   Smoking status: Never   Smokeless tobacco: Never  Substance Use Topics   Alcohol use: No    Alcohol/week: 0.0 standard drinks of alcohol   Drug use: No     Allergies   Zyrtec [cetirizine]   Review of Systems Review of Systems  Constitutional:  Positive for activity change. Negative for appetite change, fatigue and fever.  HENT:  Positive for congestion, postnasal drip, sinus pressure and sore throat. Negative for sneezing.   Respiratory:  Positive for cough. Negative for shortness of breath.   Cardiovascular:  Negative for chest pain.  Gastrointestinal:  Negative for abdominal pain, diarrhea, nausea and vomiting.  Neurological:  Negative for dizziness, light-headedness and headaches.     Physical Exam Triage Vital Signs ED Triage Vitals  Enc Vitals Group     BP 03/19/22 1325 117/63     Pulse Rate 03/19/22 1325 (!) 102     Resp 03/19/22 1325 16     Temp 03/19/22 1325 98.7 F (37.1 C)     Temp Source 03/19/22 1325 Oral     SpO2 03/19/22 1325 97 %     Weight 03/19/22 1328 220 lb (99.8 kg)     Height 03/19/22 1328 '5\' 2"'$  (1.575 m)     Head Circumference --      Peak Flow --      Pain Score 03/19/22 1327 6     Pain Loc --      Pain Edu? --      Excl. in [place]? --    No data found.  Updated Vital Signs BP 117/63 (BP Location: Left Arm)   Pulse 88 Comment: Per Azell Bill ,PA  Temp 98.7 F (37.1 C) (Oral)   Resp 16   Ht '5\' 2"'$  (1.575 m)   Wt 220 lb (99.8 kg)   SpO2 97%   BMI 40.24 kg/m   Visual Acuity Right Eye Distance:   Left Eye Distance:   Bilateral Distance:    Right Eye Near:   Left Eye Near:    Bilateral Near:     Physical Exam Vitals reviewed.  Constitutional:      General: She is  awake. She is not in acute distress.    Appearance: Normal appearance. She is well-developed. She is not ill-appearing.     Comments: Appears stated age in no acute distress sitting comfortably in exam room  HENT:     Head: Normocephalic and atraumatic.     Right Ear: Tympanic membrane, ear canal and external ear  normal. Tympanic membrane is not erythematous or bulging.     Left Ear: Tympanic membrane, ear canal and external ear normal. Tympanic membrane is not erythematous or bulging.     Nose:     Right Sinus: No maxillary sinus tenderness or frontal sinus tenderness.     Left Sinus: No maxillary sinus tenderness or frontal sinus tenderness.     Mouth/Throat:     Pharynx: Uvula midline. Posterior oropharyngeal erythema present. No oropharyngeal exudate.     Tonsils: No tonsillar exudate or tonsillar abscesses.     Comments: Erythema and drainage in posterior oropharynx Cardiovascular:     Rate and Rhythm: Normal rate and regular rhythm.     Heart sounds: Normal heart sounds, S1 normal and S2 normal. No murmur heard. Pulmonary:     Effort: Pulmonary effort is normal.     Breath sounds: Normal breath sounds. No wheezing, rhonchi or rales.     Comments: Clear to auscultation bilaterally Psychiatric:        Behavior: Behavior is cooperative.      UC Treatments / Results  Labs (all labs ordered are listed, but only abnormal results are displayed) Labs Reviewed  SARS CORONAVIRUS 2 (TAT 6-24 HRS)  POCT RAPID STREP A, ED / UC    EKG   Radiology No results found.  Procedures Procedures (including critical care time)  Medications Ordered in UC Medications - No data to display  Initial Impression / Assessment and Plan / UC Course  I have reviewed the triage vital signs and the nursing notes.  Pertinent labs & imaging results that were available during my care of the patient were reviewed by me and considered in my medical decision making (see chart for details).      Patient is well-appearing, afebrile, nontoxic, nontachycardic; was initially tachycardic on intake but this resolved with recheck.  Initially was concern for viral infection but patient reports that she is actually been symptomatic for several weeks and was concerned for sinus infection.  Strep testing was obtained and was negative.  Patient requested COVID test for her employer but discussed that given she has been symptomatic for several weeks this would not change our management.  Will start her on Augmentin given prolonged and worsening symptoms.  Discussed that antibiotics can decrease the effectiveness of birth control and she is to use backup birth control while on this medication.  Recommend she use over-the-counter medication for additional symptom relief.  She is to rest and drink plenty of fluid.  If her symptoms or not improving within a week she is to return for reevaluation.  If she has any worsening symptoms including chest pain, shortness of breath, high fever, nausea/vomiting she needs to be seen immediately.  Strict return precautions given.  Work excuse note provided.  Final Clinical Impressions(s) / UC Diagnoses   Final diagnoses:  Acute non-recurrent pansinusitis     Discharge Instructions      You tested negative for strep.  We will contact you if your COVID is positive so you can inform your employer.  You are outside the 5 days of quarantine so can return to work.  Since you have had symptoms for several weeks we are starting an antibiotic.  Take Augmentin twice daily for 7 days.  Use over-the-counter medication including Mucinex, Flonase, Tylenol for symptom relief.  Make sure that you rest and drink plenty of fluid.  If your symptoms or not improving quickly if anything worsens you need to be seen immediately.  Antibiotics can decrease the effectiveness of your birth control so make sure to use backup birth control while on this medication.     ED Prescriptions      Medication Sig Dispense Auth. Provider   amoxicillin-clavulanate (AUGMENTIN) 875-125 MG tablet Take 1 tablet by mouth every 12 (twelve) hours. 14 tablet Maikol Grassia, Derry Skill, PA-C      PDMP not reviewed this encounter.   Terrilee Croak, PA-C 03/19/22 1527

## 2022-03-19 NOTE — Discharge Instructions (Signed)
You tested negative for strep.  We will contact you if your COVID is positive so you can inform your employer.  You are outside the 5 days of quarantine so can return to work.  Since you have had symptoms for several weeks we are starting an antibiotic.  Take Augmentin twice daily for 7 days.  Use over-the-counter medication including Mucinex, Flonase, Tylenol for symptom relief.  Make sure that you rest and drink plenty of fluid.  If your symptoms or not improving quickly if anything worsens you need to be seen immediately.  Antibiotics can decrease the effectiveness of your birth control so make sure to use backup birth control while on this medication.

## 2022-03-19 NOTE — ED Triage Notes (Signed)
Patient here today for having a cough, runny nose, watery eyes, wheeze, and congestion X 4 days. She has tried Dayquil, Nyquil, Tylenol Cold and flu, Benadryl, and Theraflu tea with no relief. Symptoms have been getting worse. She works at a [employer]. No travel.

## 2022-03-20 LAB — SARS CORONAVIRUS 2 (TAT 6-24 HRS): SARS Coronavirus 2: NEGATIVE

## 2023-07-04 ENCOUNTER — Encounter: Payer: Self-pay | Admitting: *Deleted
